# Patient Record
Sex: Male | Born: 1995 | Race: Black or African American | Hispanic: No | Marital: Single
Health system: Southern US, Community
[De-identification: ages and names within clinical notes are randomized; demographics above are authoritative.]

---

## 2018-01-04 ENCOUNTER — Other Ambulatory Visit: Payer: Self-pay

## 2018-01-04 ENCOUNTER — Encounter (HOSPITAL_COMMUNITY): Payer: Self-pay

## 2018-01-04 ENCOUNTER — Emergency Department (HOSPITAL_COMMUNITY): Payer: BLUE CROSS/BLUE SHIELD

## 2018-01-04 ENCOUNTER — Emergency Department (HOSPITAL_COMMUNITY)
Admission: EM | Admit: 2018-01-04 | Discharge: 2018-01-05 | Disposition: A | Discharge status: Home / Self Care | Payer: BLUE CROSS/BLUE SHIELD | Attending: Emergency Medicine | Admitting: Emergency Medicine

## 2018-01-04 DIAGNOSIS — R0602 Shortness of breath: Secondary | ICD-10-CM | POA: Insufficient documentation

## 2018-01-04 DIAGNOSIS — R0789 Other chest pain: Secondary | ICD-10-CM | POA: Insufficient documentation

## 2018-01-04 LAB — I-STAT TROPONIN, ED: TROPONIN I, POC: 0.01 ng/mL (ref 0.00–0.08)

## 2018-01-04 LAB — CBC
HCT: 43.9 % (ref 39.0–52.0)
Hemoglobin: 15.3 g/dL (ref 13.0–17.0)
MCH: 30.3 pg (ref 26.0–34.0)
MCHC: 34.9 g/dL (ref 30.0–36.0)
MCV: 86.9 fL (ref 78.0–100.0)
Platelets: 242 10*3/uL (ref 150–400)
RBC: 5.05 MIL/uL (ref 4.22–5.81)
RDW: 13.5 % (ref 11.5–15.5)
WBC: 5.5 10*3/uL (ref 4.0–10.5)

## 2018-01-04 LAB — BASIC METABOLIC PANEL
Anion gap: 9 (ref 5–15)
BUN: 13 mg/dL (ref 6–20)
CALCIUM: 10 mg/dL (ref 8.9–10.3)
CO2: 25 mmol/L (ref 22–32)
Chloride: 106 mmol/L (ref 101–111)
Creatinine, Ser: 1.24 mg/dL (ref 0.61–1.24)
GFR calc non Af Amer: >60 mL/min (ref >60)
Glucose, Bld: 86 mg/dL (ref 65–99)
Potassium: 3.6 mmol/L (ref 3.5–5.1)
SODIUM: 140 mmol/L (ref 135–145)

## 2018-01-04 MED ORDER — GI COCKTAIL ~~LOC~~
30.0000 mL | Freq: Once | ORAL | Status: AC
  Administered 2018-01-04: 30 mL via ORAL
  Filled 2018-01-04: qty 30

## 2018-01-04 MED ORDER — KETOROLAC TROMETHAMINE 15 MG/ML IJ SOLN
15.0000 mg | Freq: Once | INTRAMUSCULAR | Status: AC
  Administered 2018-01-04: 15 mg via INTRAMUSCULAR
  Filled 2018-01-04: qty 1

## 2018-01-04 NOTE — ED Triage Notes (Signed)
Pt reports mid chest pain that started this morning after jogging with his sister's dog. He states that it has decreased since, but is still there when he takes a deep breath. A&Ox4. Ambulatory.

## 2018-01-05 LAB — I-STAT TROPONIN, ED: Troponin i, poc: 0 ng/mL (ref 0.00–0.08)

## 2018-01-05 MED ORDER — OMEPRAZOLE 20 MG PO CPDR: 20.0000 mg | DELAYED_RELEASE_CAPSULE | Freq: Every day | ORAL | 0 refills | Status: AC

## 2018-01-05 NOTE — Discharge Instructions (Signed)
Your work-up has been very reassuring in the emergency department today.  Unknown cause of your symptoms.  May be related to acid reflux.  Have given you medication to help with this.  Your blood pressure was elevated in the ED.  You need to be keeping a journal of your blood pressure and possibly follow-up with a primary care to make sure you do not need to be treated for high blood pressure.  I will also give you a follow-up to the cardiologist if your chest pain persist.  I recommend taking over-the-counter anti-inflammatories and Tylenol for pain.  Return the ED if you develop continued symptoms or worsening symptoms.

## 2018-01-05 NOTE — ED Provider Notes (Signed)
[facility]-EMERGENCY DEPT Provider Note   CSN: [id number] Arrival date & time: 01/04/18  2035     History   Chief Complaint Chief Complaint  Patient presents with  . Chest Pain    HPI Moss McYvan Lesh is a 22 y.o. male.  HPI 22 year old African-American male with no pertinent past medical history presents to the emergency department today for evaluation of chest pain.  Patient states that approximately 12:00 PM earlier today.  Patient states the chest pain was substernal.  Does not radiate.  The pain has persisted until he arrived to the ED today.  States that while in the ED the chest pain has decreased to a 2/10.  He reports some mild shortness of breath with the pain.  The pain is worse with palpation and movement.  Denies any associated nausea, vomiting or diaphoresis.  No history of same.  Patient states that on arrival to the ED had an episode of dizziness that self resolved.  Patient denies any lightheadedness or syncope symptoms.  Patient denies any history of PE/DVT, prolonged immobilization, recent hospitalization/surgeries, unilateral leg swelling or calf tenderness, hemoptysis, tobacco use.  Patient has no significant family history of early sudden cardiac death.  Patient states that he is having some pain with deep inspiration.  Patient did not take anything for his symptoms prior to arrival.  He states that this may feel like when he had epigastric pain but is unsure.  Patient has no diagnosis of GERD.  He does have a primary care doctor.  Has not seen them recently.  Pt denies any fever, chill, ha, vision changes, lightheadedness,  congestion, neck pain,  cough, abd pain, n/v/d, urinary symptoms, change in bowel habits, melena, hematochezia, lower extremity paresthesias.  History reviewed. No pertinent past medical history.  There are no active problems to display for this patient.         Home Medications    Prior to Admission medications     Medication Sig Start Date End Date Taking? Authorizing Provider  omeprazole (PRILOSEC) 20 MG capsule Take 1 capsule (20 mg total) by mouth daily. 01/05/18   Rise MuLeaphart, Kenneth T, PA-C    Family History History reviewed. No pertinent family history.  Social History Social History   Tobacco Use  . Smoking status: Not on file  Substance Use Topics  . Alcohol use: Not on file  . Drug use: Not on file     Allergies   Patient has no known allergies.   Review of Systems Review of Systems  All other systems reviewed and are negative.    Physical Exam Updated Vital Signs BP (!) 142/98 (BP Location: Right Arm)   Pulse 74   Temp (!) 97.4 F (36.3 C) (Oral)   Resp 15   Ht 6\' 1"  (1.854 m)   Wt 97.1 kg (214 lb)   SpO2 100%   BMI 28.23 kg/m   Physical Exam  Constitutional: He is oriented to person, place, and time. He appears well-developed and well-nourished.  Non-toxic appearance. No distress.  HENT:  Head: Normocephalic and atraumatic.  Nose: Nose normal.  Mouth/Throat: Oropharynx is clear and moist.  Eyes: Pupils are equal, round, and reactive to light. Conjunctivae are normal. Right eye exhibits no discharge. Left eye exhibits no discharge. No scleral icterus.  Neck: Normal range of motion. Neck supple. No JVD present. No tracheal deviation present.  Cardiovascular: Normal rate, regular rhythm, normal heart sounds and intact distal pulses. Exam reveals no gallop and no  friction rub.  No murmur heard. Pulmonary/Chest: Effort normal and breath sounds normal. No stridor. No respiratory distress. He has no wheezes. He has no rales. He exhibits no tenderness.  No hypoxia or tachypnea.    Abdominal: Soft. Bowel sounds are normal. He exhibits no distension. There is no tenderness. There is no rebound and no guarding.  Musculoskeletal: Normal range of motion.  No lower extremity edema or calf tenderness.  Lymphadenopathy:    He has no cervical adenopathy.  Neurological: He  is alert and oriented to person, place, and time.  Skin: Skin is warm and dry. Capillary refill takes less than 2 seconds. He is not diaphoretic. No pallor.  Psychiatric: His behavior is normal. Judgment and thought content normal.  Nursing note and vitals reviewed.    ED Treatments / Results  Labs (all labs ordered are listed, but only abnormal results are displayed) Labs Reviewed  BASIC METABOLIC PANEL  CBC  I-STAT TROPONIN, ED  I-STAT TROPONIN, ED    EKG EKG Interpretation  Date/Time:  Thursday January 04 2018 20:49:29 EDT Ventricular Rate:  68 PR Interval:    QRS Duration: 96 QT Interval:  367 QTC Calculation: 391 R Axis:   78 Text Interpretation:  Sinus rhythm ST elev, probable normal early repol pattern No STEMI.  Confirmed by Alona Bene [phone]) on 01/04/2018 11:04:17 PM   Radiology Dg Chest 2 View  Result Date: 01/04/2018 CLINICAL DATA:  Chest and epigastric pain. EXAM: CHEST - 2 VIEW COMPARISON:  None. FINDINGS: The cardiomediastinal contours are normal. The lungs are clear. Pulmonary vasculature is normal. No consolidation, pleural effusion, or pneumothorax. No acute osseous abnormalities are seen. IMPRESSION: Normal radiographs of the chest. Electronically Signed   By: Rubye Oaks M.D.   On: 01/04/2018 21:30    Procedures Procedures (including critical care time)  Medications Ordered in ED Medications  gi cocktail (Maalox,Lidocaine,Donnatal) (30 mLs Oral Given 01/04/18 2321)  ketorolac (TORADOL) 15 MG/ML injection 15 mg (15 mg Intramuscular Given 01/04/18 2322)     Initial Impression / Assessment and Plan / ED Course  I have reviewed the triage vital signs and the nursing notes.  Pertinent labs & imaging results that were available during my care of the patient were reviewed by me and considered in my medical decision making (see chart for details).     Pt presents to the Ed today with complaints of cp. Patient is to be discharged with recommendation to  follow up with PCP in regards to today's hospital visit. Chest pain is not likely of cardiac or pulmonary etiology d/t presentation, perc negative, VSS, no tracheal deviation, no JVD or new murmur, RRR, breath sounds equal bilaterally, EKG with nsr and early normal repol and shows no signs of ischemia, negative delta troponin, and negative CXR.  Unknown etiology of patient's symptoms.  Clinical presentation does not seem consistent with ACS, PE, dissection, pneumonia.  Possibly due to acid reflux.  Patient improved with Toradol and GI cocktail.  Patient blood pressure is elevated in the ED.  Has improved with pain management.  I discussed that he needs to monitor his blood pressure and follow-up with his primary care doctor.  We will give cardiology referral as well.  Patient has no murmurs on exam.  Doubt HCOM. Pt has been advised to return to the ED is CP becomes exertional, associated with diaphoresis or nausea, radiates to left jaw/arm, worsens or becomes concerning in any way. Pt appears reliable for follow up and is agreeable to  discharge.   Did discuss patient with Dr. Jacqulyn Bath who is agreed with the above plan.    Final Clinical Impressions(s) / ED Diagnoses   Final diagnoses:  Atypical chest pain    ED Discharge Orders        Ordered    omeprazole (PRILOSEC) 20 MG capsule  Daily     01/05/18 0050       Rise Mu, PA-C 01/05/18 0113    Long, Arlyss Repress, MD 01/05/18 [phone]

## 2019-07-11 IMAGING — CR DG CHEST 2V
2 series · 2 of 2 positions shown · non-contrast
Comparison: None.

CLINICAL DATA: Chest and epigastric pain.

EXAM:
CHEST - 2 VIEW

[w chest pa]
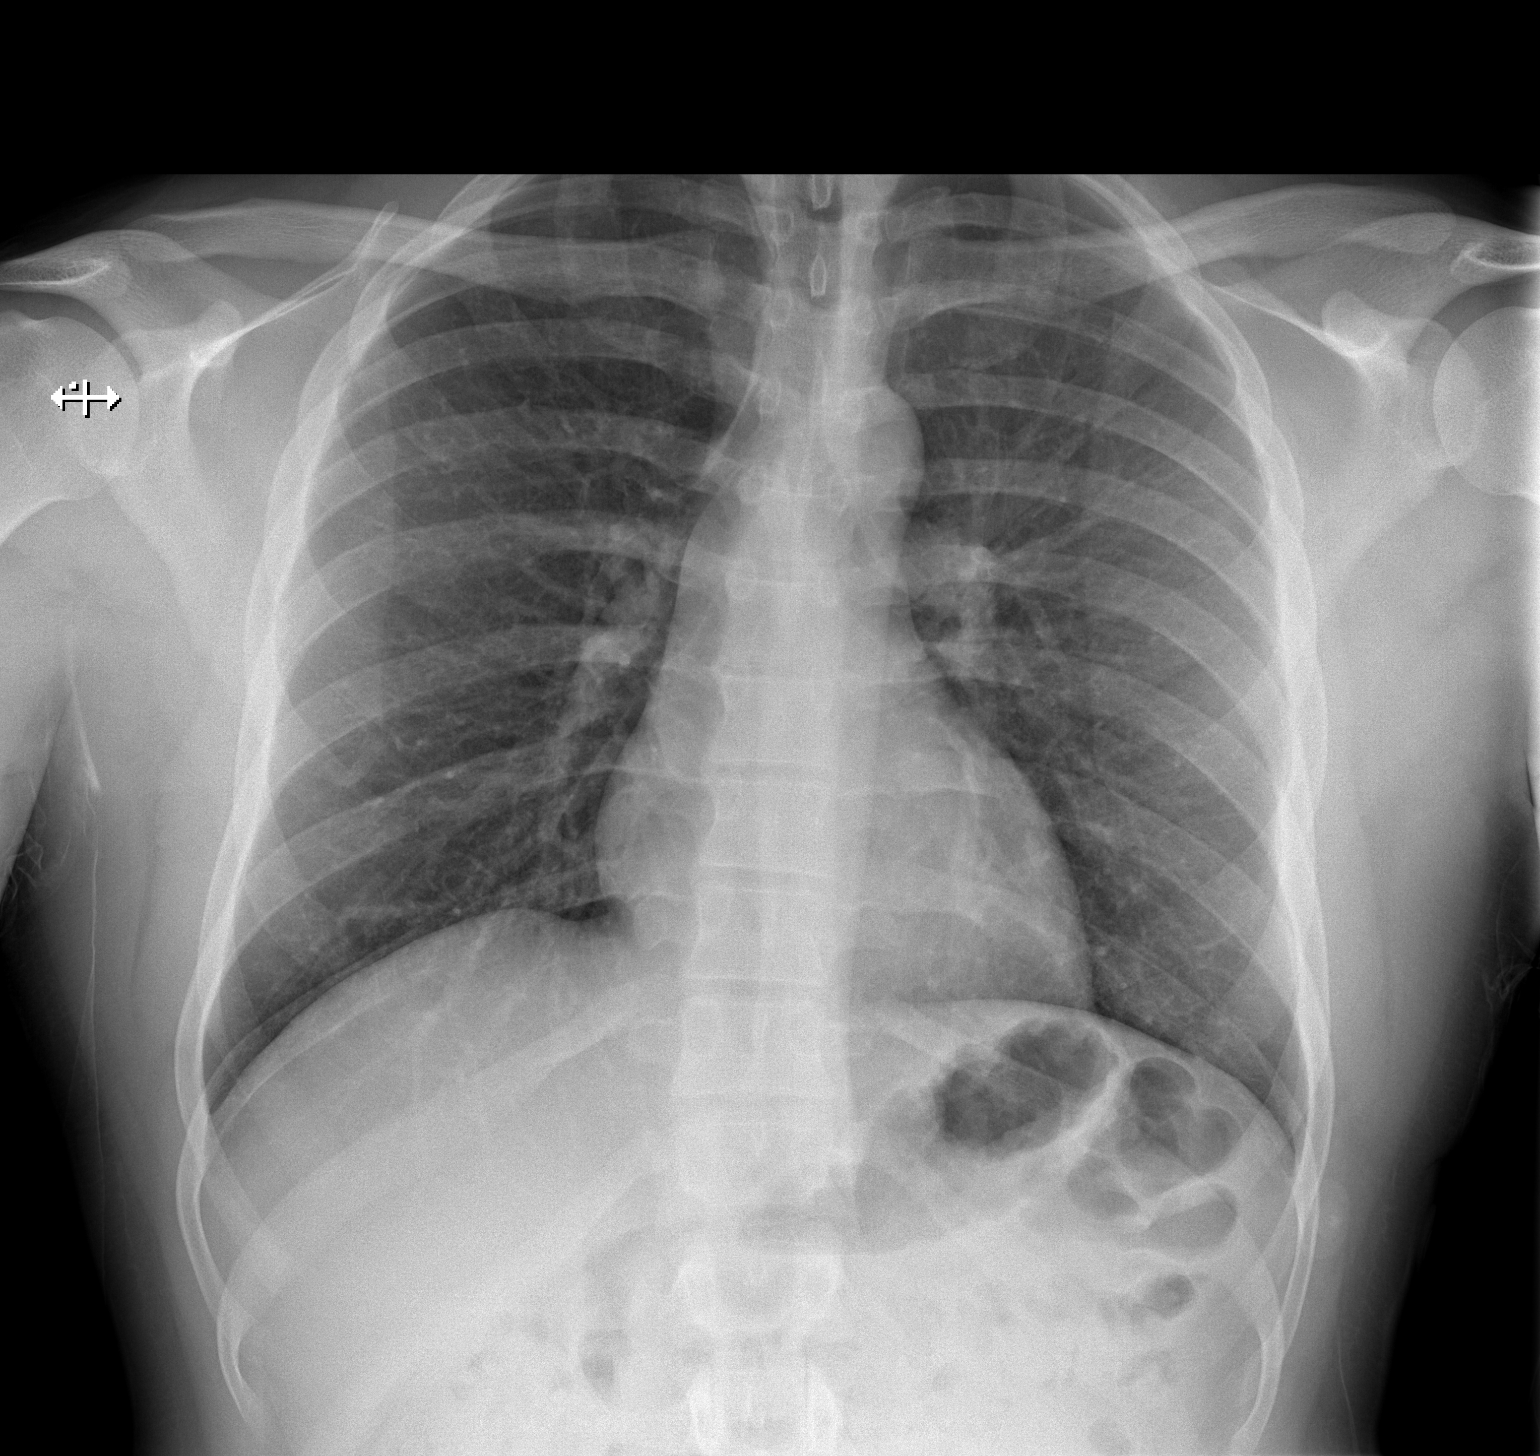

[w chest lat]
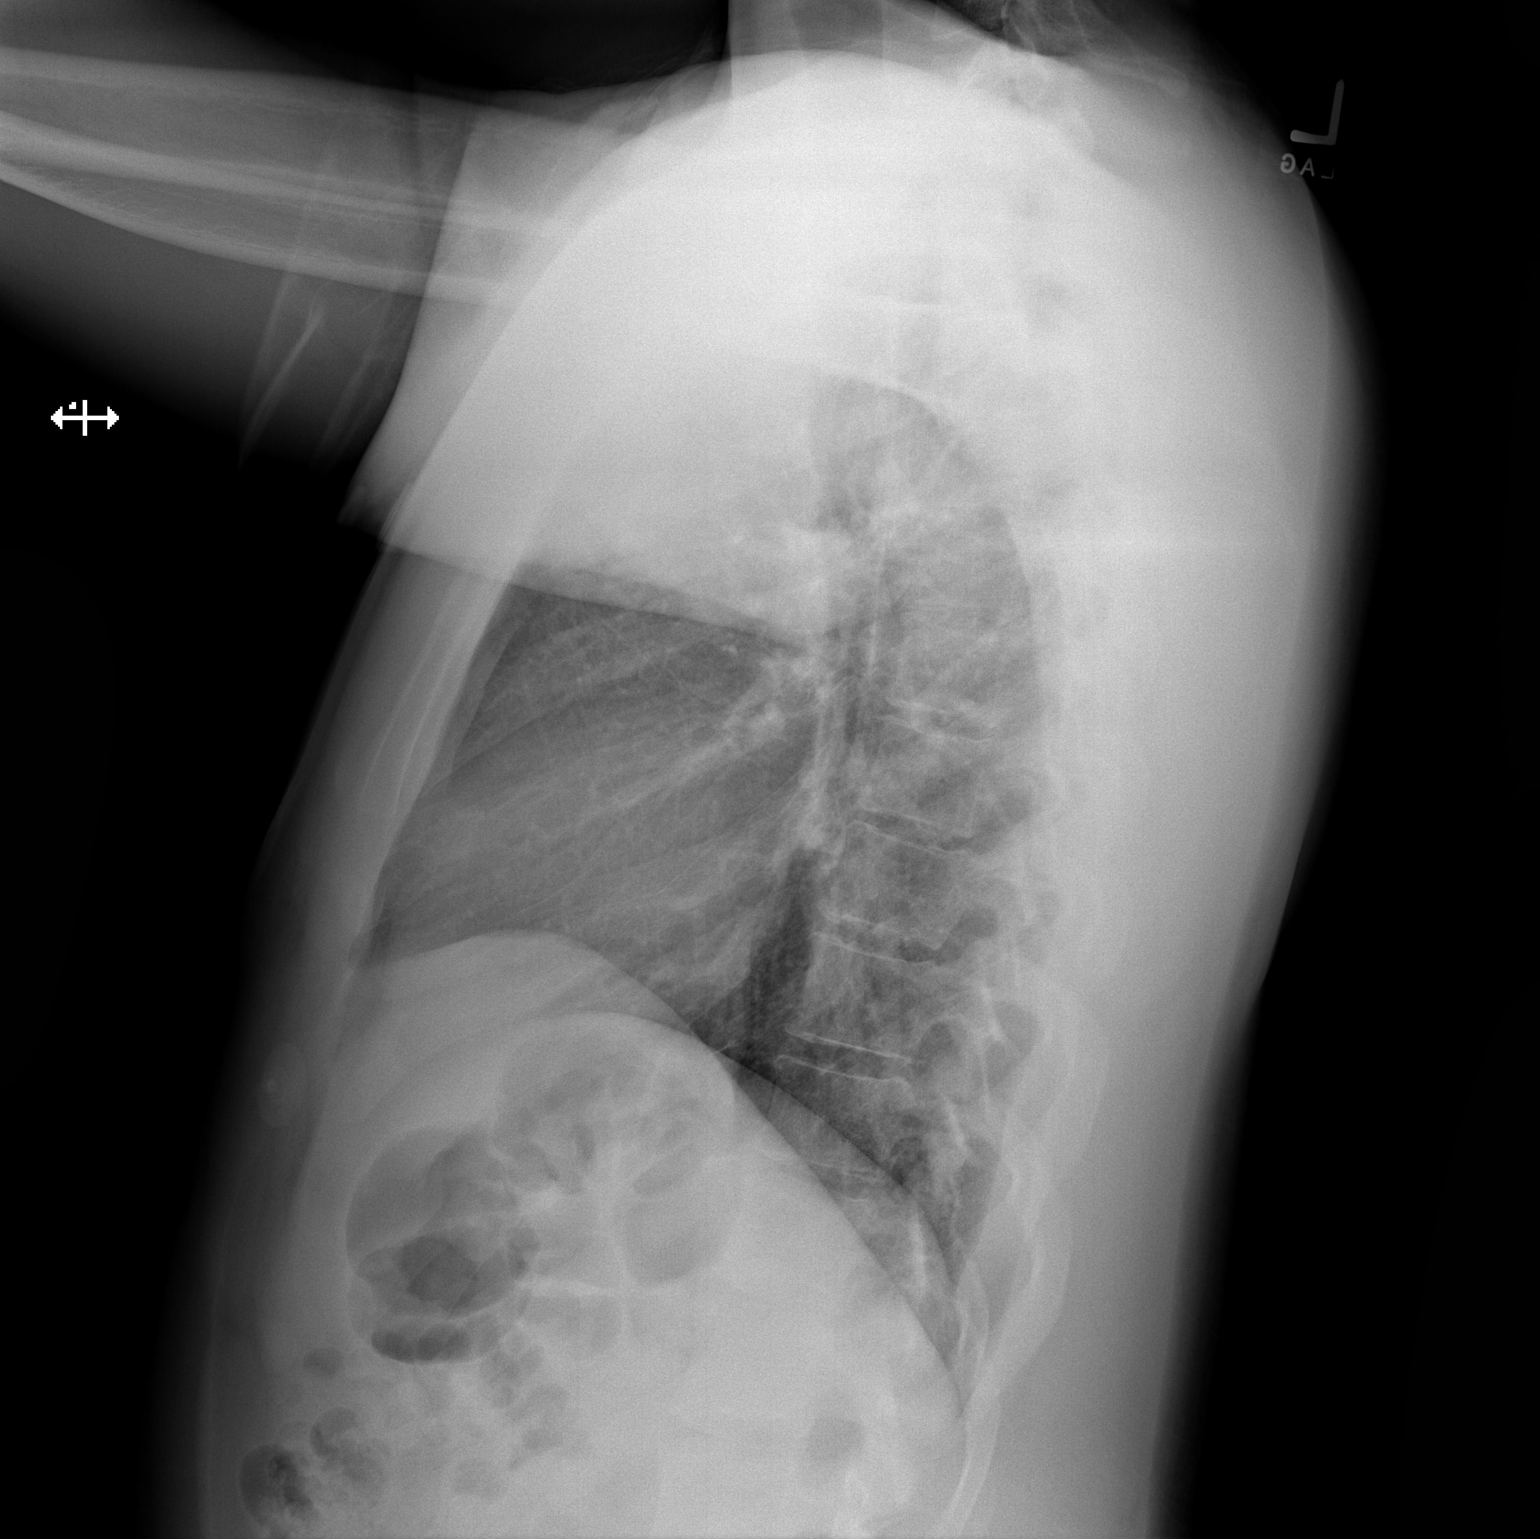

[2 of 2 positions shown; findings below may reference images not displayed]

FINDINGS: The cardiomediastinal contours are normal. The lungs are clear.
Pulmonary vasculature is normal. No consolidation, pleural effusion,
or pneumothorax. No acute osseous abnormalities are seen.
IMPRESSION: Normal radiographs of the chest.

## 2022-04-28 ENCOUNTER — Other Ambulatory Visit: Payer: Self-pay

## 2022-04-28 ENCOUNTER — Ambulatory Visit
Admission: EM | Admit: 2022-04-28 | Discharge: 2022-04-28 | Disposition: A | Discharge status: Home / Self Care | Payer: 59 | Attending: Physician Assistant | Admitting: Physician Assistant

## 2022-04-28 ENCOUNTER — Encounter: Payer: Self-pay | Admitting: Emergency Medicine

## 2022-04-28 DIAGNOSIS — J209 Acute bronchitis, unspecified: Secondary | ICD-10-CM

## 2022-04-28 MED ORDER — PREDNISONE 20 MG PO TABS: 40.0000 mg | ORAL_TABLET | Freq: Every day | ORAL | 0 refills | Status: AC

## 2022-04-28 MED ORDER — BENZONATATE 100 MG PO CAPS: 100.0000 mg | ORAL_CAPSULE | Freq: Three times a day (TID) | ORAL | 0 refills | Status: AC

## 2022-04-28 NOTE — ED Provider Notes (Signed)
[facility]    CSN: [id number] Arrival date & time: 04/28/22  4098      History   Chief Complaint Chief Complaint  Patient presents with   Cough    HPI Nathaniel Greer is a 26 y.o. male.   Patient here today for evaluation of cough has had for 3 weeks.  He reports that cough is not productive.  He has not had any sore throat or significant congestion.  He denies any ear pain.  He has tried over-the-counter medication with minimal relief of symptoms.  He does have history of bronchitis.  The history is provided by the patient.    History reviewed. No pertinent past medical history.  There are no problems to display for this patient.   History reviewed. No pertinent surgical history.     Home Medications    Prior to Admission medications   Medication Sig Start Date End Date Taking? Authorizing Provider  benzonatate (TESSALON) 100 MG capsule Take 1 capsule (100 mg total) by mouth every 8 (eight) hours. 04/28/22  Yes Francene Finders, PA-C  predniSONE (DELTASONE) 20 MG tablet Take 2 tablets (40 mg total) by mouth daily with breakfast for 5 days. 04/28/22 05/03/22 Yes Francene Finders, PA-C  omeprazole (PRILOSEC) 20 MG capsule Take 1 capsule (20 mg total) by mouth daily. 01/05/18   Doristine Devoid, PA-C    Family History History reviewed. No pertinent family history.  Social History     Allergies   Patient has no known allergies.   Review of Systems Review of Systems  Constitutional:  Negative for chills and fever.  HENT:  Negative for congestion, ear pain and sore throat.   Eyes:  Negative for discharge and redness.  Respiratory:  Positive for cough. Negative for shortness of breath.   Gastrointestinal:  Negative for nausea and vomiting.     Physical Exam Triage Vital Signs ED Triage Vitals  Enc Vitals Group     BP      Pulse      Resp      Temp      Temp src      SpO2      Weight      Height      Head Circumference      Peak Flow       Pain Score      Pain Loc      Pain Edu?      Excl. in [place]?    No data found.  Updated Vital Signs BP (!) 142/85 (BP Location: Left Arm)   Pulse 66   Temp 97.8 F (36.6 C) (Oral)   Resp 18   SpO2 96%      Physical Exam Vitals and nursing note reviewed.  Constitutional:      General: He is not in acute distress.    Appearance: Normal appearance. He is not ill-appearing.  HENT:     Head: Normocephalic and atraumatic.     Nose: Nose normal. No congestion.  Eyes:     Conjunctiva/sclera: Conjunctivae normal.  Cardiovascular:     Rate and Rhythm: Normal rate and regular rhythm.     Heart sounds: Normal heart sounds. No murmur heard. Pulmonary:     Effort: Pulmonary effort is normal. No respiratory distress.     Breath sounds: Normal breath sounds. No wheezing, rhonchi or rales.  Skin:    General: Skin is warm and dry.  Neurological:     Mental Status:  He is alert.  Psychiatric:        Mood and Affect: Mood normal.        Thought Content: Thought content normal.      UC Treatments / Results  Labs (all labs ordered are listed, but only abnormal results are displayed) Labs Reviewed - No data to display  EKG   Radiology No results found.  Procedures Procedures (including critical care time)  Medications Ordered in UC Medications - No data to display  Initial Impression / Assessment and Plan / UC Course  I have reviewed the triage vital signs and the nursing notes.  Pertinent labs & imaging results that were available during my care of the patient were reviewed by me and considered in my medical decision making (see chart for details).    Suspect bronchitis given dry cough and duration of symptoms.  Will treat with steroid burst and Tessalon Perles.  Recommended follow-up if no gradual improvement or with any further concerns.  Final Clinical Impressions(s) / UC Diagnoses   Final diagnoses:  Acute bronchitis, unspecified organism   Discharge  Instructions   None    ED Prescriptions     Medication Sig Dispense Auth. Provider   predniSONE (DELTASONE) 20 MG tablet Take 2 tablets (40 mg total) by mouth daily with breakfast for 5 days. 10 tablet Erma Pinto F, PA-C   benzonatate (TESSALON) 100 MG capsule Take 1 capsule (100 mg total) by mouth every 8 (eight) hours. 21 capsule Tomi Bamberger, PA-C      PDMP not reviewed this encounter.   Tomi Bamberger, PA-C 04/28/22 1004

## 2022-04-28 NOTE — ED Triage Notes (Signed)
Pt sts cough x 3 weeks; pt sts hx of bronchitis in past
